# Patient Record
Sex: Male | Born: 1950 | Race: White | Hispanic: No | Marital: Single | Smoking: Former smoker
Health system: Southern US, Community
[De-identification: ages and names within clinical notes are randomized; demographics above are authoritative.]

## PROBLEM LIST (undated history)

## (undated) DIAGNOSIS — I10 Essential (primary) hypertension: Secondary | ICD-10-CM

## (undated) DIAGNOSIS — I219 Acute myocardial infarction, unspecified: Secondary | ICD-10-CM

## (undated) DIAGNOSIS — C801 Malignant (primary) neoplasm, unspecified: Secondary | ICD-10-CM

## (undated) HISTORY — PX: SPLENECTOMY: SUR1306

## (undated) HISTORY — PX: HEMORROIDECTOMY: SUR656

---

## 2016-09-06 ENCOUNTER — Encounter (HOSPITAL_COMMUNITY): Payer: Self-pay | Admitting: *Deleted

## 2016-09-06 ENCOUNTER — Emergency Department (HOSPITAL_COMMUNITY): Payer: Medicare Other

## 2016-09-06 ENCOUNTER — Emergency Department (HOSPITAL_COMMUNITY)
Admission: EM | Admit: 2016-09-06 | Discharge: 2016-09-06 | Disposition: A | Discharge status: Other Acute Inpatient Hospital | Payer: Medicare Other | Attending: Emergency Medicine | Admitting: Emergency Medicine

## 2016-09-06 DIAGNOSIS — Z87891 Personal history of nicotine dependence: Secondary | ICD-10-CM | POA: Diagnosis not present

## 2016-09-06 DIAGNOSIS — I252 Old myocardial infarction: Secondary | ICD-10-CM | POA: Insufficient documentation

## 2016-09-06 DIAGNOSIS — I10 Essential (primary) hypertension: Secondary | ICD-10-CM | POA: Insufficient documentation

## 2016-09-06 DIAGNOSIS — R079 Chest pain, unspecified: Secondary | ICD-10-CM | POA: Diagnosis present

## 2016-09-06 DIAGNOSIS — R0602 Shortness of breath: Secondary | ICD-10-CM | POA: Insufficient documentation

## 2016-09-06 HISTORY — DX: Essential (primary) hypertension: I10

## 2016-09-06 HISTORY — DX: Malignant (primary) neoplasm, unspecified: C80.1

## 2016-09-06 HISTORY — DX: Acute myocardial infarction, unspecified: I21.9

## 2016-09-06 LAB — HEPATIC FUNCTION PANEL
ALBUMIN: 2 g/dL — AB (ref 3.5–5.0)
ALK PHOS: 142 U/L — AB (ref 38–126)
ALT: 37 U/L (ref 17–63)
AST: 49 U/L — ABNORMAL HIGH (ref 15–41)
BILIRUBIN INDIRECT: 0.6 mg/dL (ref 0.3–0.9)
BILIRUBIN TOTAL: 0.8 mg/dL (ref 0.3–1.2)
Bilirubin, Direct: 0.2 mg/dL (ref 0.1–0.5)
TOTAL PROTEIN: 6.4 g/dL — AB (ref 6.5–8.1)

## 2016-09-06 LAB — URINALYSIS, ROUTINE W REFLEX MICROSCOPIC
BILIRUBIN URINE: NEGATIVE
GLUCOSE, UA: NEGATIVE mg/dL
HGB URINE DIPSTICK: NEGATIVE
Ketones, ur: NEGATIVE mg/dL
Leukocytes, UA: NEGATIVE
Nitrite: NEGATIVE
Protein, ur: NEGATIVE mg/dL
SPECIFIC GRAVITY, URINE: 1.015 (ref 1.005–1.030)
pH: 6 (ref 5.0–8.0)

## 2016-09-06 LAB — CBC
HCT: 31.6 % — ABNORMAL LOW (ref 39.0–52.0)
Hemoglobin: 9.9 g/dL — ABNORMAL LOW (ref 13.0–17.0)
MCH: 24.5 pg — AB (ref 26.0–34.0)
MCHC: 31.3 g/dL (ref 30.0–36.0)
MCV: 78.2 fL (ref 78.0–100.0)
PLATELETS: 466 10*3/uL — AB (ref 150–400)
RBC: 4.04 MIL/uL — AB (ref 4.22–5.81)
RDW: 18.4 % — ABNORMAL HIGH (ref 11.5–15.5)
WBC: 11.8 10*3/uL — ABNORMAL HIGH (ref 4.0–10.5)

## 2016-09-06 LAB — BASIC METABOLIC PANEL
Anion gap: 11 (ref 5–15)
BUN: 8 mg/dL (ref 6–20)
CALCIUM: 9 mg/dL (ref 8.9–10.3)
CHLORIDE: 97 mmol/L — AB (ref 101–111)
CO2: 26 mmol/L (ref 22–32)
CREATININE: 0.74 mg/dL (ref 0.61–1.24)
GFR calc Af Amer: >60 mL/min (ref >60)
GFR calc non Af Amer: >60 mL/min (ref >60)
GLUCOSE: 126 mg/dL — AB (ref 65–99)
Potassium: 4.5 mmol/L (ref 3.5–5.1)
Sodium: 134 mmol/L — ABNORMAL LOW (ref 135–145)

## 2016-09-06 LAB — I-STAT ARTERIAL BLOOD GAS, ED
ACID-BASE EXCESS: 3 mmol/L — AB (ref 0.0–2.0)
Bicarbonate: 27.2 mmol/L (ref 20.0–28.0)
O2 SAT: 97 %
PCO2 ART: 40.7 mmHg (ref 32.0–48.0)
PH ART: 7.433 (ref 7.350–7.450)
PO2 ART: 84 mmHg (ref 83.0–108.0)
Patient temperature: 98.6
TCO2: 28 mmol/L (ref 0–100)

## 2016-09-06 LAB — I-STAT TROPONIN, ED: TROPONIN I, POC: 0.02 ng/mL (ref 0.00–0.08)

## 2016-09-06 LAB — BRAIN NATRIURETIC PEPTIDE: B Natriuretic Peptide: 224.1 pg/mL — ABNORMAL HIGH (ref 0.0–100.0)

## 2016-09-06 MED ORDER — FENTANYL CITRATE (PF) 100 MCG/2ML IJ SOLN
50.0000 ug | INTRAMUSCULAR | Status: DC | PRN
  Administered 2016-09-06 (×5): 50 ug via INTRAVENOUS
  Filled 2016-09-06 (×5): qty 2

## 2016-09-06 MED ORDER — FENTANYL CITRATE (PF) 100 MCG/2ML IJ SOLN
50.0000 ug | Freq: Once | INTRAMUSCULAR | Status: AC
  Administered 2016-09-06: 50 ug via INTRAVENOUS
  Filled 2016-09-06: qty 2

## 2016-09-06 MED ORDER — IOPAMIDOL (ISOVUE-370) INJECTION 76%
INTRAVENOUS | Status: AC
  Administered 2016-09-06: 100 mL
  Filled 2016-09-06: qty 100

## 2016-09-06 MED ORDER — PIPERACILLIN-TAZOBACTAM 3.375 G IVPB: 3.3750 g | Freq: Three times a day (TID) | INTRAVENOUS | Status: DC

## 2016-09-06 MED ORDER — PIPERACILLIN-TAZOBACTAM 3.375 G IVPB 30 MIN
3.3750 g | Freq: Once | INTRAVENOUS | Status: AC
  Administered 2016-09-06: 3.375 g via INTRAVENOUS
  Filled 2016-09-06: qty 50

## 2016-09-06 NOTE — ED Provider Notes (Signed)
Spoke with Sonia Side with [facility] PAL line. Patient has been accepted by Dr. Marcello Moores at [facility] and will go to the Heme-Onc service under Dr. Kendall Flack. Bed is being arranged at this time. Patient updated. Awaiting bed availability.   Duffy Bruce, MD 09/06/16 [phone]

## 2016-09-06 NOTE — ED Notes (Signed)
ED Provider at bedside. 

## 2016-09-06 NOTE — ED Triage Notes (Signed)
Pt c/o mid Cp onset x 3 mths ago, pt has Lung Cancer pt lives at [facility] assisted living, pt completed radiation x 3 wks, pt reported to be SOB, pt Dr. Felipa Eth at [facility] recommended pt come here for pericentesis d/t x ray completed yesterday, pt speaks in short sentences, A&O x4

## 2016-09-06 NOTE — ED Provider Notes (Signed)
[facility] DEPT Provider Note   CSN: [id number] Arrival date & time: 09/06/16  [id number]     History   Chief Complaint Chief Complaint  Patient presents with  . Chest Pain    HPI Troy Spears is a 66 y.o. male.  HPI   Troy Spears is a 66 y.o. male, with a history of Metastatic small cell lung cancer, HTN, and MI, presenting to the ED with Shortness of breath increasing over the last 3 days, much worse today. Pt is an assisted living resident at [facility]. Dr. Felipa Eth, the residents' physician, evaluated the patient yesterday. Noted a left-sided pleural effusion on x-ray at that time. Patient was scheduled for a thoracentesis via IR today. However, patient was acutely short of breath when the staff checked on him today. His son brought him to the closest ED due to the pt having shortness of breath.  Patient complains of pain to the left lower back and pelvis, consistent with his pain associated with a known tumor in the area.   Pt also complains of left lower leg swelling and pain beginning four days ago. States both his legs were somewhat swollen recently and he was placed on lasix. This seemed to help, but then four days ago his left lower leg began to swell larger and became painful.   Denies LOC, current chest pain, N/V/D, fever/chills, abdominal pain, or any other complaints.   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 Currently on palliative care. Last radiation treatment was 3 weeks ago.  He has a previously noted endobrachial lesion left mainstem bronchus with left mediastinal shift.  Oncologist is Dr. Mindi Junker through [facility] (per son).    Past Medical History:  Diagnosis Date  . Cancer ([place])   . Hypertension   . MI (myocardial infarction)     There are no active problems to display for this patient.   Past Surgical  History:  Procedure Laterality Date  . HEMORROIDECTOMY    . SPLENECTOMY         Home Medications    Prior to Admission medications   Medication Sig Start Date End Date Taking? Authorizing Provider  celecoxib (CELEBREX) 100 MG capsule Take 100 mg by mouth 2 (two) times daily.   Yes Historical Provider, MD  famotidine (PEPCID) 20 MG tablet Take 20 mg by mouth 2 (two) times daily.   Yes Historical Provider, MD  furosemide (LASIX) 20 MG tablet Take 20 mg by mouth daily.   Yes Historical Provider, MD  gabapentin (NEURONTIN) 300 MG capsule Take 300 mg by mouth 3 (three) times daily.   Yes Historical Provider, MD  Melatonin 3 MG TABS Take 3 mg by mouth at bedtime.   Yes Historical Provider, MD  metoprolol succinate (TOPROL-XL) 50 MG 24 hr tablet Take 50 mg by mouth daily. Take with or immediately following a meal.   Yes Historical Provider, MD  morphine (ROXANOL) 20 MG/ML concentrated solution Take 5 mg by mouth every 2 (two) hours as needed for severe pain.   Yes Historical Provider, MD  nicotine (NICODERM CQ - DOSED IN MG/24 HR) 7 mg/24hr patch Place 7 mg onto the skin daily.   Yes Historical Provider, MD  nystatin (MYCOSTATIN) 100000 UNIT/ML suspension Take 5 mLs by mouth 4 (four) times daily.   Yes Historical Provider, MD  Oxycodone HCl 10 MG TABS Take 10 mg by  mouth every morning. May also take 10mg  every 6 hours as needed   Yes Historical Provider, MD  Oxycodone HCl 20 MG TABS Take 20 mg by mouth at bedtime. May also take every 6 hours as needed   Yes Historical Provider, MD  polyethylene glycol (MIRALAX / GLYCOLAX) packet Take 17 g by mouth 2 (two) times daily.   Yes Historical Provider, MD  sennosides-docusate sodium (SENOKOT-S) 8.6-50 MG tablet Take 1 tablet by mouth at bedtime.   Yes Historical Provider, MD  thiamine 100 MG tablet Take 100 mg by mouth daily.   Yes Historical Provider, MD    Family History No family history on file.  Social History Social History  Substance Use  Topics  . Smoking status: Former Smoker    Types: Cigarettes    Quit date: 08/16/2016  . Smokeless tobacco: Never Used  . Alcohol use No     Allergies   Penicillins   Review of Systems Review of Systems  Constitutional: Negative for chills and fever.  Respiratory: Positive for shortness of breath. Negative for cough.   Cardiovascular: Positive for chest pain and leg swelling.  Gastrointestinal: Negative for nausea and vomiting.  Musculoskeletal: Positive for back pain.  All other systems reviewed and are negative.    Physical Exam Updated Vital Signs BP 128/85 (BP Location: Right Arm)   Pulse 89   Temp 98 F (36.7 C) (Oral)   Resp 26   Ht 5\' 11"  (1.803 m)   Wt 99.8 kg   SpO2 97%   BMI 30.68 kg/m   Physical Exam  Constitutional: He appears well-developed and well-nourished. No distress.  HENT:  Head: Normocephalic and atraumatic.  Eyes: Conjunctivae are normal.  Neck: Neck supple.  Cardiovascular: Normal rate, regular rhythm, normal heart sounds and intact distal pulses.   Pulmonary/Chest: He has decreased breath sounds (Entire left lung).  Increased work of breathing noted. Patient speaks in short phrases.  Abdominal: Soft. There is no tenderness. There is no guarding.  Musculoskeletal: He exhibits no edema.  Some swelling and tenderness noted to the posterior left calf. Full range of motion at the left knee and ankle.  Lymphadenopathy:    He has no cervical adenopathy.  Neurological: He is alert.  Skin: Skin is warm and dry. He is not diaphoretic.  Psychiatric: He has a normal mood and affect. His behavior is normal.  Nursing note and vitals reviewed.    ED Treatments / Results  Labs (all labs ordered are listed, but only abnormal results are displayed) Labs Reviewed  BASIC METABOLIC PANEL - Abnormal; Notable for the following:       Result Value   Sodium 134 (*)    Chloride 97 (*)    Glucose, Bld 126 (*)    All other components within normal limits    CBC - Abnormal; Notable for the following:    WBC 11.8 (*)    RBC 4.04 (*)    Hemoglobin 9.9 (*)    HCT 31.6 (*)    MCH 24.5 (*)    RDW 18.4 (*)    Platelets 466 (*)    All other components within normal limits  BRAIN NATRIURETIC PEPTIDE - Abnormal; Notable for the following:    B Natriuretic Peptide 224.1 (*)    All other components within normal limits  HEPATIC FUNCTION PANEL - Abnormal; Notable for the following:    Total Protein 6.4 (*)    Albumin 2.0 (*)    AST 49 (*)  Alkaline Phosphatase 142 (*)    All other components within normal limits  I-STAT ARTERIAL BLOOD GAS, ED - Abnormal; Notable for the following:    Acid-Base Excess 3.0 (*)    All other components within normal limits  URINALYSIS, ROUTINE W REFLEX MICROSCOPIC  I-STAT TROPOININ, ED   Hemoglobin  Date Value Ref Range Status  09/06/2016 9.9 (L) 13.0 - 17.0 g/dL Final    EKG  EKG Interpretation  Date/Time:  Friday September 06 2016 09:42:25 EST Ventricular Rate:  90 PR Interval:    QRS Duration: 92 QT Interval:  351 QTC Calculation: 430 R Axis:   -59 Text Interpretation:  Sinus rhythm Left anterior fascicular block Abnormal R-wave progression, late transition Borderline T abnormalities, anterior leads No old tracing to compare No STE or ST depressions Confirmed by ISAACS MD, CAMERON [phone]) on 09/06/2016 9:52:59 AM       Radiology Ct Angio Chest Pe W Or Wo Contrast  Result Date: 09/06/2016 CLINICAL DATA:  Acute shortness of breath. Abnormal chest x-ray demonstrating complete opacification of the left hemithorax. History of lung cancer. EXAM: CT ANGIOGRAPHY CHEST WITH CONTRAST TECHNIQUE: Multidetector CT imaging of the chest was performed using the standard protocol during bolus administration of intravenous contrast. Multiplanar CT image reconstructions and MIPs were obtained to evaluate the vascular anatomy. CONTRAST:  100 cc Omnipaque 350 COMPARISON:  Chest x-ray, same date.  No prior CTs are  available. FINDINGS: Chest wall: No chest wall mass, supraclavicular or axillary lymphadenopathy. Cardiovascular: The heart is normal in size. No pericardial effusion. The aorta is normal in caliber. Moderate atherosclerotic calcifications at the aortic arch and at the branch vessel ostia. No dissection or focal aneurysm. Three-vessel coronary artery calcifications. Mediastinum/Nodes: Large mass invading the left mediastinum including the left mainstem bronchus and invading the left atrium through the pulmonary veins. No mediastinal or right hilar lymph nodes. The esophagus is grossly normal. Lungs/Pleura: Large central lung mass invading the hilum and mediastinum measuring approximately 7 x 6 cm. The left mainstem bronchus is occluded and the left lung is drowned. There is also a moderate to large left pleural effusion which is likely malignant. Small scattered para-aortic lymph nodes are noted likely reflecting metastatic disease. 3D motion artifact in the right long but no obvious metastatic pulmonary nodules. Areas of right middle lobe atelectasis are noted. Upper Abdomen: Large bilateral adrenal gland metastasis. The left measures 6 x 6 cm. Two metastatic lesions involving the right adrenal gland. The upper lesion measures 2.8 cm and the lower lesion measures 2.5 cm. No obvious hepatic metastasis. The gallbladder appears inflamed. Right upper quadrant ultrasound may be helpful for further evaluation. Status post splenectomy. Musculoskeletal: No obvious lytic or sclerotic bone lesions to suggest metastatic disease. Review of the MIP images confirms the above findings. IMPRESSION: 1. Large left central lung mass invading the left hilum and mediastinum. It is obstructing the left mainstem bronchus with associated drowned left lung. There is also a moderate to large left pleural effusion. 2. Tumor is invading the left atrium via the pulmonary veins which are occluded. 3. No evidence of metastatic disease involving  the right lung. 4. Bilateral adrenal gland metastasis. 5. No definite hepatic or osseous metastasis. 6. The gallbladder appears inflamed. Right upper quadrant ultrasound may be helpful for further evaluation. Electronically Signed   By: Marijo Sanes M.D.   On: 09/06/2016 11:55   Dg Chest Portable 1 View  Result Date: 09/06/2016 CLINICAL DATA:  66 year old male with shortness of breath. Patient  reports Metastatic Lung cancer, a completed radiation therapy 3 weeks ago. EXAM: PORTABLE CHEST 1 VIEW COMPARISON:  None. FINDINGS: Portable AP upright view at 1006 hours. Complete whiteout of the left hemithorax with some leftward deviation of the mediastinum. The visible right mediastinal contour and the visible right lung appear within normal limits. Visualized tracheal air column is within normal limits. IMPRESSION: Complete whiteout of the left hemithorax but this is of unclear etiology and significance in the setting of treated metastatic lung cancer with no prior studies. Severe left lung collapse is possible. Large left pleural effusion is felt less likely due to apparent leftward mediastinal shift. Chest CTA is pending at this time. Electronically Signed   By: Genevie Ann M.D.   On: 09/06/2016 10:21    Procedures Procedures (including critical care time)  Medications Ordered in ED Medications  fentaNYL (SUBLIMAZE) injection 50 mcg (50 mcg Intravenous Given 09/06/16 1438)  fentaNYL (SUBLIMAZE) injection 50 mcg (50 mcg Intravenous Given 09/06/16 1002)  iopamidol (ISOVUE-370) 76 % injection (100 mLs  Contrast Given 09/06/16 1049)  fentaNYL (SUBLIMAZE) injection 50 mcg (50 mcg Intravenous Given 09/06/16 1136)  fentaNYL (SUBLIMAZE) injection 50 mcg (50 mcg Intravenous Given 09/06/16 1317)     Initial Impression / Assessment and Plan / ED Course  I have reviewed the triage vital signs and the nursing notes.  Pertinent labs & imaging results that were available during my care of the patient were reviewed by me  and considered in my medical decision making (see chart for details).     Patient presents with shortness of breath. Patient's breathing significantly improved once the patient was sat upright and his pain was controlled. Patient with significant left-sided effusion. May need admission for possible thoracentesis, however, due to the extend of the patient's cancer and his prognosis, this may not be possible. Since his oncology care is managed at [facility], patient inquires as to whether it would be possible for him to be transferred and admitted to [facility]. For the sake of continuity of care, I don't think this is unreasonable.   1:20 PM Spoke with Dr. Nunzio Cobbs, coverage for Dr. Mindi Junker with [facility] oncology.  Requests that I emphasize to the patient and the family that there may not be anything they can do for him, but Dr. Marcello Moores will check bed availability for admission.  2:44 PM Followed up on the admission request via the Physician Access Line. They state they are waiting for the patient to be assigned to a Hem/Onc team by Dr. Marcello Moores. Then they can put in the bed request. No update on timeline.   Findings and plan of care discussed with Duffy Bruce, MD. Dr. Ellender Hose personally evaluated and examined this patient.   End of shift patient care handoff report given to Irena Cords, PA-C. Plan: Mina Marble should call us back to notify us of bed assignment. Once this is done, patient can be transferred for direct admit. In the mean time, pain management and anything else to make him more comfortable. May have to call the PAL line at [facility] to follow up on the patient's status.   Vitals:   09/06/16 0945 09/06/16 1000 09/06/16 1015 09/06/16 1030  BP: 128/85 113/72 120/83 111/80  Pulse: 89 85 86 89  Resp: 26 23 25 21   Temp: 98 F (36.7 C)     TempSrc: Oral     SpO2: 97% 96% 95% 95%  Weight:      Height:       Vitals:  09/06/16 1430 09/06/16 1445 09/06/16 1545 09/06/16 1600  BP: 118/85 113/84  127/67 123/83  Pulse: 93 96 98 94  Resp: 20 16 23 21   Temp:      TempSrc:      SpO2: 98% 97% 94% 92%  Weight:      Height:         Final Clinical Impressions(s) / ED Diagnoses   Final diagnoses:  Shortness of breath    New Prescriptions New Prescriptions   No medications on file     Layla Maw 09/06/16 1614    Duffy Bruce, MD 09/06/16 [phone]

## 2016-09-06 NOTE — ED Notes (Signed)
Pt stated he needed to use restroom very badly. Delayed EKG. Pt giving urine sample.

## 2016-09-06 NOTE — ED Notes (Signed)
Pt updated about admission

## 2016-09-06 NOTE — Progress Notes (Signed)
Pharmacy Antibiotic Note  Troy Spears is a 66 y.o. male admitted on 09/06/2016 with intra-abdominal infection.  Pharmacy has been consulted for zosyn dosing. Afebrile, WBC 11.8, CrCl>100.  Plan: Zosyn 3.375g IV (42min inf) x1; then 3.375g IV q8h (4h inf) Monitor clinical progress, c/s, renal function, abx plan/LOT   Height: 5\' 11"  (180.3 cm) Weight: 220 lb (99.8 kg) IBW/kg (Calculated) : 75.3  Temp (24hrs), Avg:98 F (36.7 C), Min:98 F (36.7 C), Max:98 F (36.7 C)   Recent Labs Lab 09/06/16 0947  WBC 11.8*  CREATININE 0.74    Estimated Creatinine Clearance: 110.8 mL/min (by C-G formula based on SCr of 0.74 mg/dL).    Allergies  Allergen Reactions  . Penicillins Other (See Comments)    Elicia Lamp, PharmD, BCPS Clinical Pharmacist 09/06/2016 4:47 PM

## 2016-09-06 NOTE — ED Triage Notes (Signed)
Pt needing to use the restroom, will get EKG when he returns.

## 2016-09-06 NOTE — ED Notes (Signed)
The pt want to know id he is being admitted

## 2016-09-06 NOTE — ED Notes (Signed)
Pt to CT

## 2016-09-06 NOTE — ED Notes (Signed)
Bridger Reddic would like to be updated [phone]

## 2016-09-11 LAB — CULTURE, BLOOD (ROUTINE X 2)
CULTURE: NO GROWTH
CULTURE: NO GROWTH

## 2016-10-13 DEATH — deceased

## 2017-07-14 IMAGING — CT CT ANGIO CHEST
1 of 9 series · 13 of 36 positions shown · IV contrast (Iohexol (Omnipaque 350))
Comparison: Chest x-ray, same date.  No prior CTs are available.

CLINICAL DATA: Acute shortness of breath. Abnormal chest x-ray
demonstrating complete opacification of the left hemithorax. History
of lung cancer.

EXAM:
CT ANGIOGRAPHY CHEST WITH CONTRAST
TECHNIQUE: Multidetector CT imaging of the chest was performed using the
standard protocol during bolus administration of intravenous
contrast. Multiplanar CT image reconstructions and MIPs were
obtained to evaluate the vascular anatomy.
CONTRAST:  100 cc Omnipaque 350

[Series 406: thins pacs · axial · 0.79mm/px · z∈[+743,+1040]mm · 13 of 347 slices shown]
[im 25/347  lung]
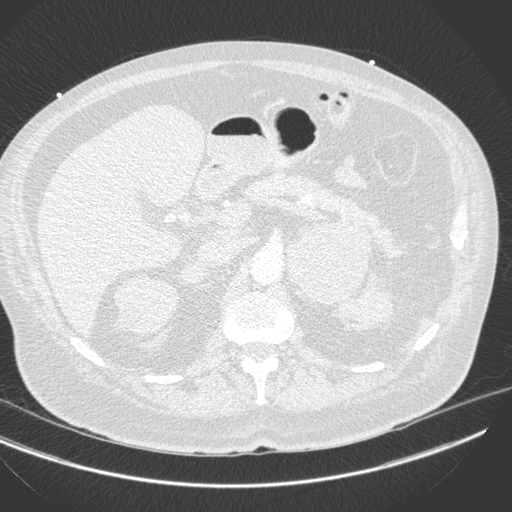
[im 50/347  mediastinal]
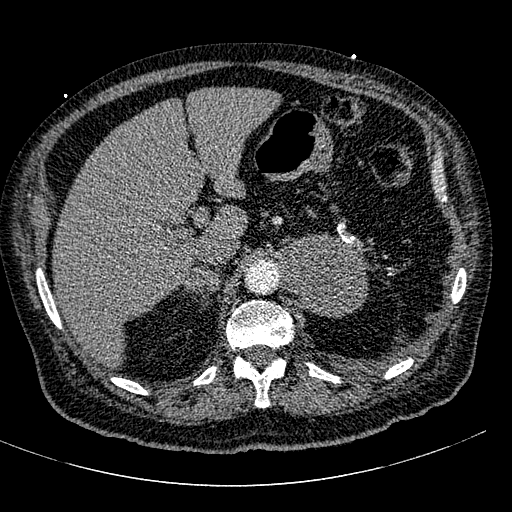
[im 75/347  lung]
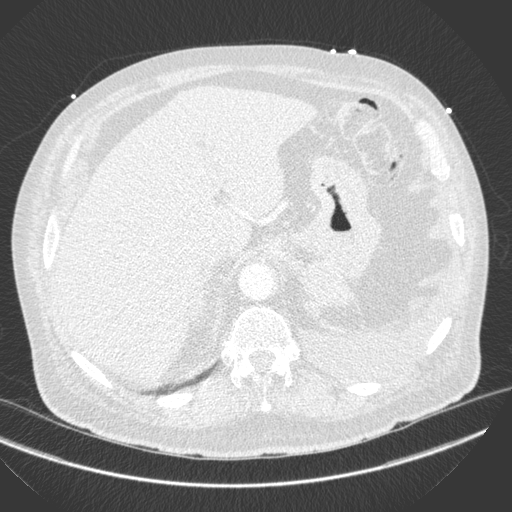
[im 99/347  mediastinal]
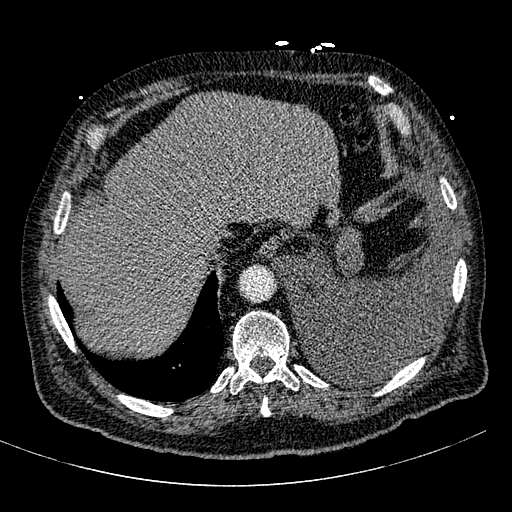
[im 124/347  lung]
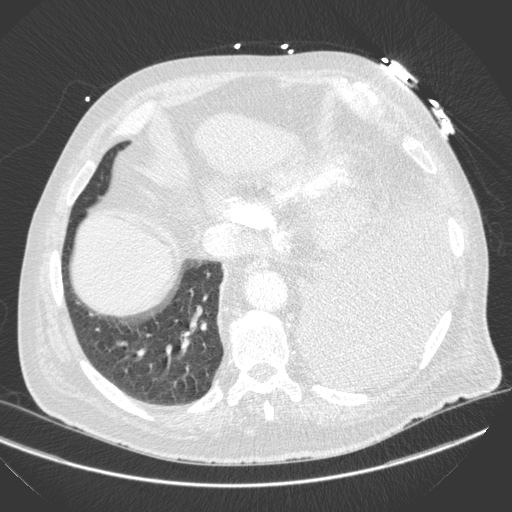
[im 149/347  mediastinal]
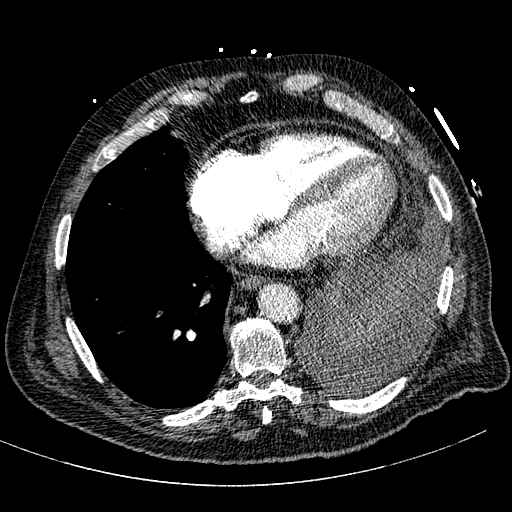
[im 174/347  lung]
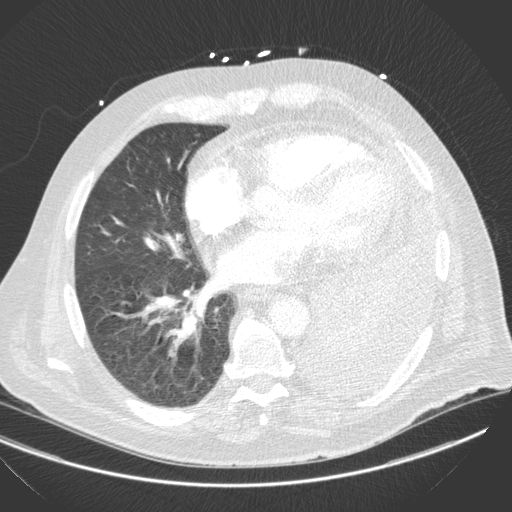
[im 198/347  mediastinal]
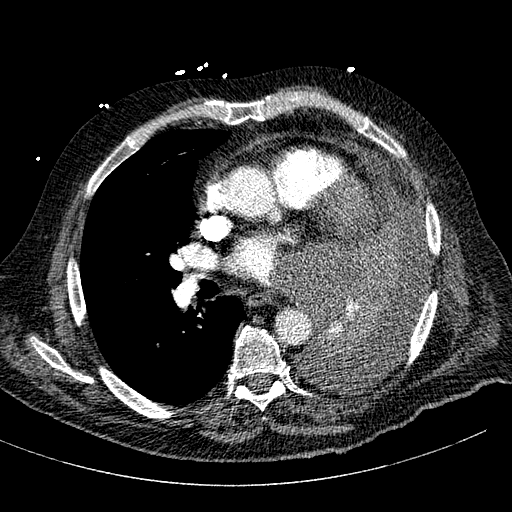
[im 223/347  lung]
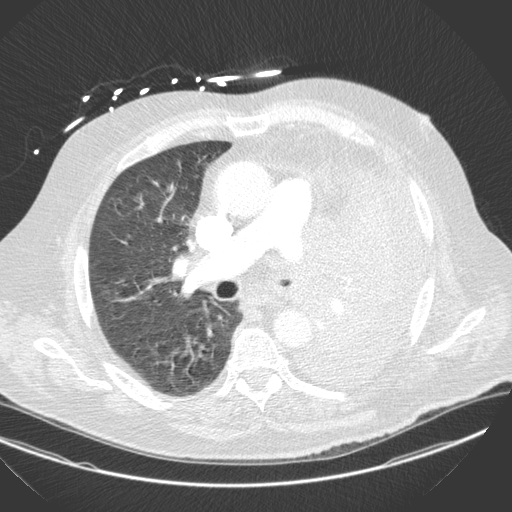
[im 248/347  mediastinal]
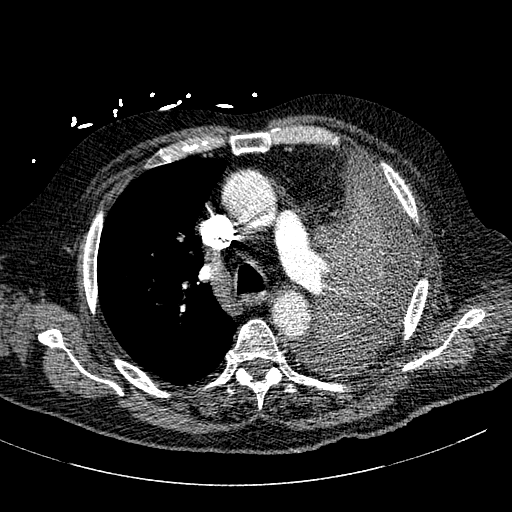
[im 272/347  lung]
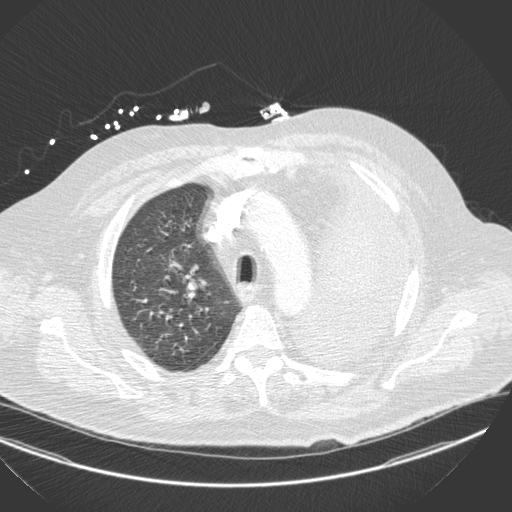
[im 297/347  mediastinal]
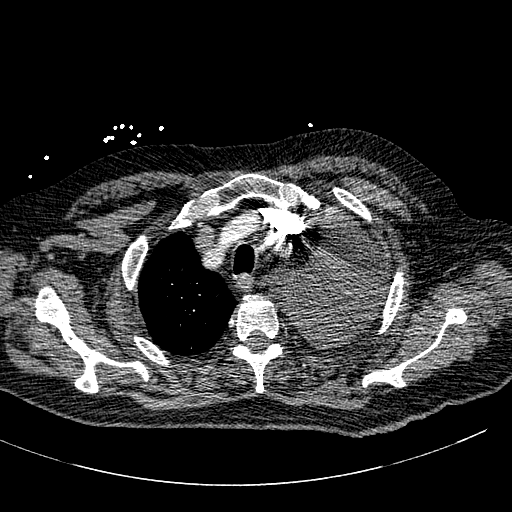
[im 322/347  lung]
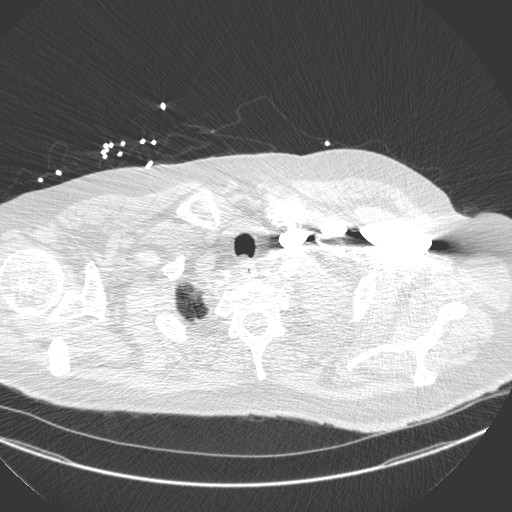

[13 of 36 positions shown; findings below may reference images not displayed]

FINDINGS: Chest wall: No chest wall mass, supraclavicular or axillary
lymphadenopathy.

Cardiovascular: The heart is normal in size. No pericardial
effusion. The aorta is normal in caliber. Moderate atherosclerotic
calcifications at the aortic arch and at the branch vessel ostia. No
dissection or focal aneurysm. Three-vessel coronary artery
calcifications.

Mediastinum/Nodes: Large mass invading the left mediastinum
including the left mainstem bronchus and invading the left atrium
through the pulmonary veins.

No mediastinal or right hilar lymph nodes. The esophagus is grossly
normal.

Lungs/Pleura: Large central lung mass invading the hilum and
mediastinum measuring approximately 7 x 6 cm. The left mainstem
bronchus is occluded and the left lung is drowned. There is also a
moderate to large left pleural effusion which is likely malignant.
Small scattered para-aortic lymph nodes are noted likely reflecting
metastatic disease.

3D motion artifact in the right long but no obvious metastatic
pulmonary nodules. Areas of right middle lobe atelectasis are noted.

Upper Abdomen: Large bilateral adrenal gland metastasis. The left
measures 6 x 6 cm. Two metastatic lesions involving the right
adrenal gland. The upper lesion measures 2.8 cm and the lower lesion
measures 2.5 cm. No obvious hepatic metastasis. The gallbladder
appears inflamed. Right upper quadrant ultrasound may be helpful for
further evaluation.

Status post splenectomy.

Musculoskeletal: No obvious lytic or sclerotic bone lesions to
suggest metastatic disease.

Review of the MIP images confirms the above findings.
IMPRESSION: 1. Large left central lung mass invading the left hilum and
mediastinum. It is obstructing the left mainstem bronchus with
associated drowned left lung. There is also a moderate to large left
pleural effusion.
2. Tumor is invading the left atrium via the pulmonary veins which
are occluded.
3. No evidence of metastatic disease involving the right lung.
4. Bilateral adrenal gland metastasis.
5. No definite hepatic or osseous metastasis.
6. The gallbladder appears inflamed. Right upper quadrant ultrasound
may be helpful for further evaluation.
